# Patient Record
Sex: Male | Born: 1975 | Race: White | Hispanic: No | Marital: Married | Smoking: Never smoker
Health system: Southern US, Community
[De-identification: ages and names within clinical notes are randomized; demographics above are authoritative.]

## PROBLEM LIST (undated history)

## (undated) HISTORY — PX: KNEE RECONSTRUCTION: SHX5883

---

## 2013-12-30 ENCOUNTER — Inpatient Hospital Stay: Payer: Self-pay | Admitting: Internal Medicine

## 2013-12-30 LAB — URINALYSIS, COMPLETE
Bacteria: NONE SEEN
Bilirubin,UR: NEGATIVE
Glucose,UR: NEGATIVE mg/dL (ref 0–75)
Ketone: NEGATIVE
Leukocyte Esterase: NEGATIVE
Nitrite: NEGATIVE
PH: 6 (ref 4.5–8.0)
PROTEIN: NEGATIVE
Specific Gravity: 1.023 (ref 1.003–1.030)
Squamous Epithelial: NONE SEEN
WBC UR: 1 /HPF (ref 0–5)

## 2013-12-30 LAB — DIFFERENTIAL
BASOS ABS: 0 10*3/uL (ref 0.0–0.1)
BASOS PCT: 0.5 %
Eosinophil #: 0.1 10*3/uL (ref 0.0–0.7)
Eosinophil %: 0.6 %
LYMPHS ABS: 0.9 10*3/uL — AB (ref 1.0–3.6)
Lymphocyte %: 10.2 %
MONO ABS: 1.4 x10 3/mm — AB (ref 0.2–1.0)
Monocyte %: 15.6 %
NEUTROS PCT: 73.1 %
Neutrophil #: 6.6 10*3/uL — ABNORMAL HIGH (ref 1.4–6.5)

## 2013-12-30 LAB — COMPREHENSIVE METABOLIC PANEL
ALBUMIN: 3.3 g/dL — AB (ref 3.4–5.0)
Alkaline Phosphatase: 78 U/L
Anion Gap: 7 (ref 7–16)
BUN: 10 mg/dL (ref 7–18)
Bilirubin,Total: 0.6 mg/dL (ref 0.2–1.0)
Calcium, Total: 8.5 mg/dL (ref 8.5–10.1)
Chloride: 99 mmol/L (ref 98–107)
Co2: 27 mmol/L (ref 21–32)
Creatinine: 0.86 mg/dL (ref 0.60–1.30)
GLUCOSE: 101 mg/dL — AB (ref 65–99)
OSMOLALITY: 266 (ref 275–301)
Potassium: 4.2 mmol/L (ref 3.5–5.1)
SGOT(AST): 50 U/L — ABNORMAL HIGH (ref 15–37)
SGPT (ALT): 81 U/L — ABNORMAL HIGH (ref 12–78)
Sodium: 133 mmol/L — ABNORMAL LOW (ref 136–145)
Total Protein: 7.6 g/dL (ref 6.4–8.2)

## 2013-12-30 LAB — CBC
HCT: 40.4 % (ref 40.0–52.0)
HGB: 13.9 g/dL (ref 13.0–18.0)
MCH: 29.4 pg (ref 26.0–34.0)
MCHC: 34.3 g/dL (ref 32.0–36.0)
MCV: 86 fL (ref 80–100)
Platelet: 147 10*3/uL — ABNORMAL LOW (ref 150–440)
RBC: 4.72 10*6/uL (ref 4.40–5.90)
RDW: 13.3 % (ref 11.5–14.5)
WBC: 9 10*3/uL (ref 3.8–10.6)

## 2013-12-30 LAB — SEDIMENTATION RATE: Erythrocyte Sed Rate: 46 mm/hr — ABNORMAL HIGH (ref 0–15)

## 2013-12-30 LAB — CK: CK, TOTAL: 34 U/L — AB

## 2013-12-30 LAB — MONONUCLEOSIS SCREEN: Mono Test: NEGATIVE

## 2013-12-31 LAB — CBC WITH DIFFERENTIAL/PLATELET
Basophil #: 0 10*3/uL (ref 0.0–0.1)
Basophil %: 0.5 %
EOS ABS: 0.1 10*3/uL (ref 0.0–0.7)
Eosinophil %: 1.1 %
HCT: 36.5 % — ABNORMAL LOW (ref 40.0–52.0)
HGB: 12.5 g/dL — ABNORMAL LOW (ref 13.0–18.0)
LYMPHS ABS: 1 10*3/uL (ref 1.0–3.6)
LYMPHS PCT: 13.6 %
MCH: 29.5 pg (ref 26.0–34.0)
MCHC: 34.4 g/dL (ref 32.0–36.0)
MCV: 86 fL (ref 80–100)
Monocyte #: 1.5 x10 3/mm — ABNORMAL HIGH (ref 0.2–1.0)
Monocyte %: 20.1 %
Neutrophil #: 4.7 10*3/uL (ref 1.4–6.5)
Neutrophil %: 64.7 %
Platelet: 142 10*3/uL — ABNORMAL LOW (ref 150–440)
RBC: 4.26 10*6/uL — AB (ref 4.40–5.90)
RDW: 12.7 % (ref 11.5–14.5)
WBC: 7.3 10*3/uL (ref 3.8–10.6)

## 2013-12-31 LAB — COMPREHENSIVE METABOLIC PANEL
ALK PHOS: 69 U/L
ALT: 67 U/L (ref 12–78)
AST: 38 U/L — AB (ref 15–37)
Albumin: 3 g/dL — ABNORMAL LOW (ref 3.4–5.0)
Anion Gap: 7 (ref 7–16)
BUN: 9 mg/dL (ref 7–18)
Bilirubin,Total: 0.5 mg/dL (ref 0.2–1.0)
Calcium, Total: 8.1 mg/dL — ABNORMAL LOW (ref 8.5–10.1)
Chloride: 101 mmol/L (ref 98–107)
Co2: 24 mmol/L (ref 21–32)
Creatinine: 0.89 mg/dL (ref 0.60–1.30)
EGFR (African American): >60
EGFR (Non-African Amer.): >60
Glucose: 101 mg/dL — ABNORMAL HIGH (ref 65–99)
Osmolality: 263 (ref 275–301)
POTASSIUM: 3.9 mmol/L (ref 3.5–5.1)
Sodium: 132 mmol/L — ABNORMAL LOW (ref 136–145)
TOTAL PROTEIN: 6.8 g/dL (ref 6.4–8.2)

## 2013-12-31 LAB — RAPID HIV-1/2 QL/CONFIRM: HIV-1/2,Rapid Ql: NEGATIVE

## 2014-01-01 LAB — CBC WITH DIFFERENTIAL/PLATELET
BASOS ABS: 1 %
Basophil #: 0 10*3/uL (ref 0.0–0.1)
Basophil %: 0.6 %
COMMENT - H1-COM1: NORMAL
Eosinophil #: 0.1 10*3/uL (ref 0.0–0.7)
Eosinophil %: 1.7 %
HCT: 36.9 % — ABNORMAL LOW (ref 40.0–52.0)
HGB: 12.5 g/dL — AB (ref 13.0–18.0)
Lymphocyte #: 1.1 10*3/uL (ref 1.0–3.6)
Lymphocyte %: 15.4 %
Lymphocytes: 16 %
MCH: 29.3 pg (ref 26.0–34.0)
MCHC: 34 g/dL (ref 32.0–36.0)
MCV: 86 fL (ref 80–100)
Monocyte #: 1.2 x10 3/mm — ABNORMAL HIGH (ref 0.2–1.0)
Monocyte %: 16.9 %
Monocytes: 12 %
NEUTROS ABS: 4.6 10*3/uL (ref 1.4–6.5)
NEUTROS PCT: 65.4 %
Platelet: 154 10*3/uL (ref 150–440)
RBC: 4.28 10*6/uL — ABNORMAL LOW (ref 4.40–5.90)
RDW: 12.9 % (ref 11.5–14.5)
Segmented Neutrophils: 71 %
WBC: 7 10*3/uL (ref 3.8–10.6)

## 2014-01-01 LAB — LACTATE DEHYDROGENASE: LDH: 246 U/L — ABNORMAL HIGH (ref 85–241)

## 2014-01-02 LAB — CBC WITH DIFFERENTIAL/PLATELET
BASOS ABS: 0 10*3/uL (ref 0.0–0.1)
BASOS PCT: 0.5 %
Eosinophil #: 0.2 10*3/uL (ref 0.0–0.7)
Eosinophil %: 2.4 %
HCT: 36.7 % — ABNORMAL LOW (ref 40.0–52.0)
HGB: 12.6 g/dL — AB (ref 13.0–18.0)
Lymphocyte #: 1 10*3/uL (ref 1.0–3.6)
Lymphocyte %: 14.3 %
MCH: 29.4 pg (ref 26.0–34.0)
MCHC: 34.4 g/dL (ref 32.0–36.0)
MCV: 86 fL (ref 80–100)
Monocyte #: 1 x10 3/mm (ref 0.2–1.0)
Monocyte %: 14.9 %
NEUTROS ABS: 4.5 10*3/uL (ref 1.4–6.5)
Neutrophil %: 67.9 %
Platelet: 166 10*3/uL (ref 150–440)
RBC: 4.28 10*6/uL — AB (ref 4.40–5.90)
RDW: 13 % (ref 11.5–14.5)
WBC: 6.6 10*3/uL (ref 3.8–10.6)

## 2014-01-03 LAB — BASIC METABOLIC PANEL
Anion Gap: 8 (ref 7–16)
BUN: 7 mg/dL (ref 7–18)
CHLORIDE: 104 mmol/L (ref 98–107)
Calcium, Total: 7.9 mg/dL — ABNORMAL LOW (ref 8.5–10.1)
Co2: 24 mmol/L (ref 21–32)
Creatinine: 0.74 mg/dL (ref 0.60–1.30)
EGFR (African American): >60
EGFR (Non-African Amer.): >60
Glucose: 127 mg/dL — ABNORMAL HIGH (ref 65–99)
Osmolality: 272 (ref 275–301)
Potassium: 3.6 mmol/L (ref 3.5–5.1)
Sodium: 136 mmol/L (ref 136–145)

## 2014-01-04 LAB — CULTURE, BLOOD (SINGLE)

## 2014-01-11 ENCOUNTER — Ambulatory Visit: Payer: Self-pay

## 2014-05-19 ENCOUNTER — Ambulatory Visit: Payer: Self-pay | Admitting: Family Medicine

## 2014-06-15 ENCOUNTER — Ambulatory Visit: Payer: Self-pay

## 2015-01-01 NOTE — Consult Note (Signed)
PATIENT NAME:  Joe Morgan, Joe Morgan#:  [id number] DATE OF BIRTH:  08-25-1976  DATE OF CONSULTATION:  12/31/2013  REFERRING PHYSICIAN:  Vipul S. Sherryll BurgerShah, MD  CONSULTING PHYSICIAN:  Stann Mainlandavid P. Sampson GoonFitzgerald, MD  REASON FOR CONSULTATION: Fever of unknown origin and lymphadenopathy.   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 He states that he had had no real preceding symptoms when he started to develop fatigue and a fever. He was seen in an urgent care and given a Z-Pak, although no testing was done at that time. He says it did not help with his symptoms, and he went back to an urgent care where he had some blood work done and had a prostate exam that felt quite tender. It is unclear if a urinalysis or urine culture was done at that time, but he was started on ciprofloxacin. He said he started to feel a little better but continued to have fevers up to 103. He was admitted with continued fevers and tachycardia. He had a CT scan done that showed some retroperitoneal adenopathy.   Currently, the patient says he had less of a fever last night after receiving a dose of Zosyn but has been febrile again today this afternoon. He denies any headaches, neck stiffness, sore throat, ear pain, swollen glands, chest pain, cough, shortness of breath, abdominal pain, nausea, vomiting or diarrhea. He has not really had any dysuria or hematuria. He has no rash, joint pain or swelling.   PAST MEDICAL HISTORY: Prostatitis, 2 episodes in the past.   SURGICAL HISTORY: Right and left knee reconstructive surgery in the past.   ALLERGIES: No known drug allergies.   MEDICATIONS AT HOME: Propranolol and ciprofloxacin.   Since admission, he has received a dose of Zosyn.   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 He has not had any exposures to  any illnesses over the last several months. He has 4 children who are 10 and above. None of them have had any illnesses. There is a high school student who is a foreign Therapist, sportsexchange student from [place] living with them, but he has been well. He does have some pet dogs, cats and a [place] pig but no farm animals. He has been doing some remodeling in the house but no exposure to dust, molds or chemicals. He is outdoors quite a bit but cannot recall any tick bites. He has had no recent travel outside of the state.   FAMILY HISTORY: Positive for a grandfather with stomach cancer.   REVIEW OF SYSTEMS: Eleven systems reviewed and negative except as per HPI.   PHYSICAL EXAM:  VITAL SIGNS: On admission, his temperature was 101.9. Since admission, his T-max has been 102. Pulse 75, blood pressure 114/66, respirations 18, pulse ox 100% on room air.  GENERAL: He is pleasant, interactive, not confused, in no acute distress, lying in bed.  HEENT: Pupils equal, round and reactive to light and accommodation. Extraocular movements are intact. Sclerae are anicteric. Oropharynx is clear. He has no thrush. He has no oral lesions. He has no pharyngeal exudate.  NECK: Supple. He has no anterior cervical, supraclavicular or posterior cervical lymphadenopathy.  HEART: Regular.  LUNGS: Clear.  ABDOMEN: Soft, nontender and nondistended. No hepatosplenomegaly.  EXTREMITIES: There is no clubbing, cyanosis or edema.  SKIN: There is  no rash or joint swelling. He has no edema.  NEUROLOGIC: He is alert and oriented x 3. Cranial nerves II through XII are intact. Strength is 5 out of 5 bilateral upper extremities and lower extremities.   DATA: White blood count 9.0 on admission, 7.3 today. Sed rate is 46. Hemoglobin 12.5, platelets 142. Differential shows 4.7 neutrophils, 1.0 lymphocytes and 1.5 monocytes. Blood cultures x 2 are negative. Urinalysis had 2 red cells and 1 white cell. Monospot was negative. LFTs showed a mild elevation of  ALT of 81, AST 50, albumin slightly low at 3.0. Renal function is normal. RPR and Lyme disease antibodies, as well as [facility] spotted fever, are pending.   IMAGING: A CT scan of his abdomen done on admission showed mild abnormal retroperitoneal adenopathy with some stranding in the fat associated with the adenopathy. The liver, gallbladder, spleen, pancreas, adrenal glands and kidneys are normal. No bowel wall thickening. There are some small mesenteric nodes. Bladder and prostate are unremarkable. No bony abnormalities. Chest x-ray revealed no acute abnormalities.   IMPRESSION: A 39 year old previously quite healthy gentleman who works as a Emergency planning/management officer but has no sick exposures, unusual contacts or travel history. Admitted with fevers for 11 days. He has minimal other symptoms except for night sweats and some loss of appetite. So far, blood work shows a normal white count except for mild increase in his monocytes. He had a mild increase in his ALT and AST as well, but these were normal. He seemed to have been treated for prostatitis, although it is unclear if culture results are available. CT of the abdomen does show some mild lymphadenopathy.   1. At this point, I would recommend we treat him empirically for tickborne illnesses with doxycycline, especially given the mild thrombocytopenia.  2. He says he defervesced with 1 dose of Zosyn given yesterday. I am going to put him back on that empirically awaiting the blood culture results.  3. He has Lyme and [facility] spotted fever serologies pending. Ehrlichia EBV, CMV and parvovirus B19 PCRs. Also check an HIV test.  4. Depending on the results of these tests and further clinical evaluation, further recommendations will be pending.   Thank you for the consult.   ____________________________ Stann Mainland. Sampson Goon, MD dpf:gb D: 12/31/2013 20:10:18 ET T: 01/01/2014 00:11:53 ET JOB#: [id number]  cc: Stann Mainland. Sampson Goon, MD, <Dictator> Briona Korpela  Sampson Goon MD ELECTRONICALLY SIGNED 01/06/2014 11:51

## 2015-01-01 NOTE — Discharge Summary (Signed)
PATIENT NAME:  Joe Morgan, Joe Morgan MR#:  [id number] DATE OF BIRTH:  [birth year]  DATE OF ADMISSION:  12/30/2013 DATE OF DISCHARGE:  01/03/2014 Joe Morgan ADMISSION DIAGNOSIS: Fever of unknown origin.   DISCHARGE DIAGNOSES: 1. Fever of unknown origin. 2. Thrombocytopenia.  3. Hyponatremia.   CONSULTATIONS:  1. Dr. Sampson Morgan.  2. Dr. Wendie Simmeramiah.   DISCHARGE LABORATORIES: Sodium 136, potassium 3.6, chloride 104, bicarbonate 24, BUN 7, creatinine 0.74 glucose is 127.  Rapid HIV was negativerocky MT IgM was negative. IgG ROCKY MT was negative, ehrlichia antibody panel was negative.  Lyme disease was normal. RPR was negative. Mono test was negative.   HOSPITAL COURSE: A 39 year old male who presented with 10 days of fevers at home with a recent diagnosis of prostatitis. For further details, please refer to the H and P.  1. Fever of unknown origin. The patient had a full workup. So far everything has been negative. We are uncertain of the etiology. Infectious disease was consulted with Dr. Sampson Morgan. He was initially placed on Zosyn changed to Unasyn. He has been afebrile with temperatures less than 100.4 and is doing okay. He will be discharged with Augmentin for 10 days. He will need a repeat CT of the abdomen in 3 months due to a small lymph node seen on CT scan on admission. 5. Thrombocytopenia, improved. 6. Hyponatremia, improved.  7. History of prostatitis, was on ciprofloxacin prior to admission.   DISCHARGE MEDICATIONS:  1. Augmentin 875 b.i.d. for 10 days.  2. Propranolol 20 mg daily.   DISCHARGE DIET: Regular diet.   DISCHARGE ACTIVITY: As tolerated.  DISCHARGE FOLLOWUP: Patient to follow up with Dr. Sampson Morgan in 1 week.  TIME SPENT: 35 minutes.   The patient is stable for discharge.   ____________________________ Joe Morgan P. Joe PinaMody, MD spm:lt D: 01/03/2014 12:43:30 ET T: 01/04/2014 00:58:29 ET JOB#: [id number]  cc: Joe Morgan P. Joe PinaMody, MD, <Dictator> Joe Mainlandavid P. Sampson GoonFitzgerald, MD Joe PesaSITAL P Karlie Aung  MD ELECTRONICALLY SIGNED 01/04/2014 12:10

## 2015-01-01 NOTE — H&P (Signed)
PATIENT NAME:  Joe Morgan, Joe Morgan MR#:  [id number] DATE OF BIRTH:  1975-09-30  DATE OF ADMISSION:  12/30/2013  PRIMARY CARE PHYSICIAN:  None.  REFERRING PHYSICIAN:  Dr. Ferman Hamming.   CHIEF COMPLAINT: Fever.   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 The patient has been running fever for last 10 days, went to [facility] urgent care last Wednesday when he was given prescription for Z-Pak for 5 days, stating he might have had secondary infection in the lung with possible flulike illness. The patient took it for 3 days, but did not get a whole lot better and continued to get fever and went back to urgent care last Sunday, when he had a full evaluation and was informed that he might have had prostatitis, based on prostrate exam at the urgent care where his prostate was found to be a little bit bigger, and he also had some blood in the urine. The patient took 7 doses of ciprofloxacin without significant benefit, had his fever spike up to 103.3 last night and decided to come to the Emergency Department. While in the ED, he is feeling very tired, and he had a temperature of 101.9. He was tachycardic up to 103 and tachypneic with respirations of 22 per minute, and he is being admitted for further evaluation and management, as his CT scan of the abdomen and pelvis showed significant amount of retroperitoneal adenopathy with stranding. The patient denies any travel history. Denies any bird or occupational exposure. He also denies taking any other drugs other than propranolol, which he has been on for a long time. He denies any localizing symptoms other than just generalized fatigue. He does have a cat and dog and one other animal, but no birds at home. He denies any other sick contacts. Denies any possibility of hepatitis or HIV or sexually transmitted disease. He does report losing 8 to 9 pounds over the last 10 days due to poor appetite. His  fever runs usually between 99.5 and 100.5, but about every third to fourth day, does spike up 103, which he had last night, up to 103.3, and he breaks out with profuse sweat and he refuses to take any Tylenol, so he usually requests just changing the sheets every time he sweats out.  He feels he might have overdone some work last night with his son's game and has been feeling very tired.   PAST MEDICAL HISTORY:  Prostatitis, 2 episodes in the last 20 years; last one being 6 to 7 years ago.   PAST SURGICAL HISTORY:  Right and left knee reconstruction surgery about 16 to 17 years ago.   MEDICATIONS AT HOME: 1.  Propranolol 20 mg p.o. daily for benign tremors.  2.  Antibiotic, ciprofloxacin 500 mg p.o. b.i.d., which was prescribed last Sunday.   ALLERGIES: No known drug allergies.   SOCIAL HISTORY: No smoking or alcohol. 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 He resides in [place], [place].   FAMILY HISTORY: The patient's paternal grandfather had stomach cancer, maternal grandfather had diabetes and heart problems.   REVIEW OF SYSTEMS:  CONSTITUTIONAL: Positive for fever, fatigue, weakness and weight loss of about 8 to 9 pounds over the last 10 days.  EYES: No blurred or double vision.  ENT: No tinnitus or ear pain.  RESPIRATORY: No cough, wheezing, hemoptysis.  CARDIOVASCULAR: No chest pain, orthopnea, edema.  GASTROINTESTINAL: No nausea, vomiting, diarrhea.  GENITOURINARY: No dysuria or  hematuria.  ENDOCRINE: No polyuria or nocturia.  HEMATOLOGY: No anemia or easy bruising.  SKIN: No rash or lesion.  MUSCULOSKELETAL: Generalized body aches. No arthritis.  NEUROLOGIC: No tingling, numbness, weakness.  PSYCHIATRIC: No history of anxiety or depression.  SKIN: No obvious rash, lesion or ulcer.   PHYSICAL EXAMINATION: VITAL SIGNS: Temperature 101.9, heart about 103 per minute, respirations 22 per minute, blood pressure 127/72 mmHg. He was saturating 96% on room air.  GENERAL:   The patient is a 39 year old white male lying in the bed, comfortably without any acute distress.  EYES: Pupils equal, round, reactive to light and accommodation. No scleral icterus. Extraocular  muscles intact.   HENT: Head atraumatic, normocephalic. Oropharynx and nasopharynx clear.  NECK: Supple. No jugular venous distension.  No enlarged thyroid. No tenderness.  LUNGS: Clear to auscultation bilaterally. No wheezing, rales, rhonchi or crepitation.  CARDIOVASCULAR: S1, S2 normal. No murmurs, rubs or gallops.  ABDOMEN: Soft, nontender, nondistended. Bowel sounds present. No organomegaly or mass.  EXTREMITIES: No pedal edema, cyanosis or clubbing.  NEUROLOGIC: Cranial nerves III through XII intact. Muscle strength 5/5 in all extremities. Sensation intact.  PSYCHIATRIC: The patient is alert and oriented x 3.  SKIN: No obvious rash, lesion or ulcer.  MUSCULOSKELETAL: No obvious joint effusion or tenderness.   LABORATORY DATA: Normal BMP, except sodium 133. Normal CBC, except platelet count of 147. Negative UA. Mono test was negative. Liver function tests showed AST of 50, ALT of 81.   Chest x-ray in the ED showed no acute cardiopulmonary disease.   CT scan of the abdomen and pelvis with contrast showed abnormal retroperitoneal adenopathy associated with stranding, concerning for inflammatory process; malignancy not excluded. Consider opportunistic infection or viral adenitis.   IMPRESSION AND PLAN: 1.  Fever of unknown origin. Certainly could be viral, although cannot rule out any bacterial or any other infectious etiology. Other possibilities certainly can be malignancy, including lymphoma or connective tissue disorders, including rheumatological disease. The patient denies any travel, animal exposure or occupational exposure. Denies any immunosuppression or drug history. He does not have any localizing symptoms either. Considering his recurrent nature of fever with fever curve and weight loss  along with abdominal adenopathy, cannot rule out lymphoma at this time, considering his excessive sweating.  We will consult oncology, along with infectious disease for further evaluation. We will monitor his fever curve very closely. We will order ESR, C-reactive protein at this time. His chest x-ray has been clear.  Two sets of blood cultures have been ordered. We will order sputum culture.  2. Thrombocytopenia. Again, could go with his fever of unknown origin, probably from underlying infection. Certainly could have lymphoma. We will monitor his platelet count.  3.  Hyponatremia, likely due to dehydration with poor p.o. intake. We will hydrate him with IV fluids and monitor his sodium level.  4.  History of prostatitis. I will  hold off on ciprofloxacin right now. We will empirically treat him with Zosyn at this time. He already has a UA, which looks clear.   Total time taking care of this patient is 55 minutes.     ____________________________ Lucina Mellow. Manuella Ghazi, MD vss:dmm D: 12/30/2013 21:22:49 ET T: 12/30/2013 22:03:40 ET JOB#: [id number]  cc: Perrie Ragin S. Manuella Ghazi, MD, <Dictator> Cheral Marker. Ola Spurr, MD Lucina Mellow [facility] MD ELECTRONICALLY SIGNED 01/01/2014 23:16

## 2015-01-01 NOTE — Consult Note (Signed)
Brief Consult Note: Diagnosis: Retroperitoneal LN.   Comments: Small lymph nodes only mildly enlarged. Suspect inflammatory response. Should repeat CT Abd in 3 months.  Electronic Signatures: Antony Hasteamiah, Roshaunda Starkey S (MD)  (Signed 23-Apr-15 10:41)  Authored: Brief Consult Note   Last Updated: 23-Apr-15 10:41 by Antony Hasteamiah, Teryl Mcconaghy S (MD)

## 2015-04-24 IMAGING — CT CT ABD-PELV W/ CM
2 of 4 series · 17 of 46 positions shown, 19 images · IV contrast (isovue)
Comparison: CT abdomen pelvis 12/30/2013

CLINICAL DATA: Fever of unknown origin

EXAM:
CT ABDOMEN AND PELVIS WITH CONTRAST
TECHNIQUE: Multidetector CT imaging of the abdomen and pelvis was performed
using the standard protocol following bolus administration of
intravenous contrast.
CONTRAST:  100 mL Isovue 370 IV

[Series 2: routine with · axial · 0.79mm/px · z∈[-934,-474]mm · 14 of 102 slices shown, 16 images]
[im 5/102  soft-tissue]
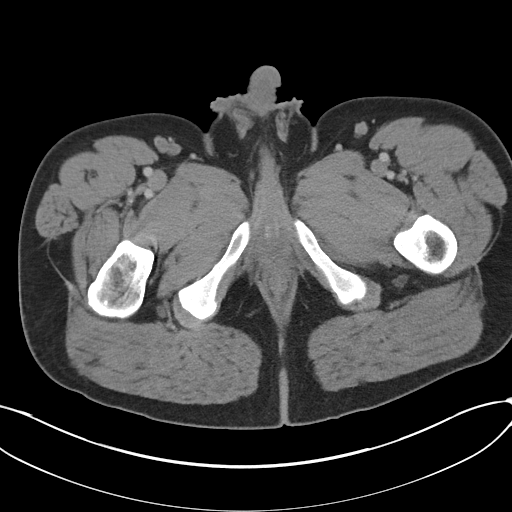
[im 5/102  bone]
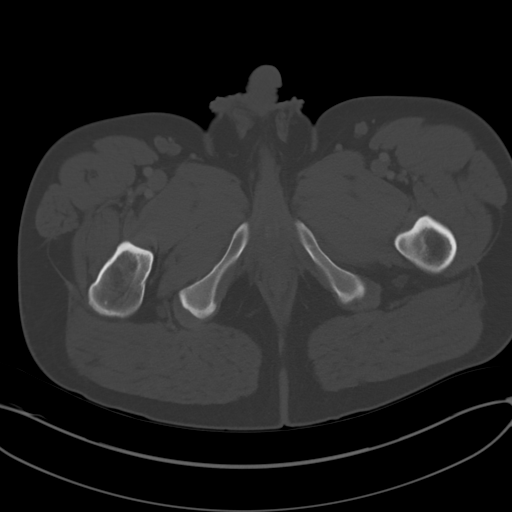
[im 13/102  soft-tissue]
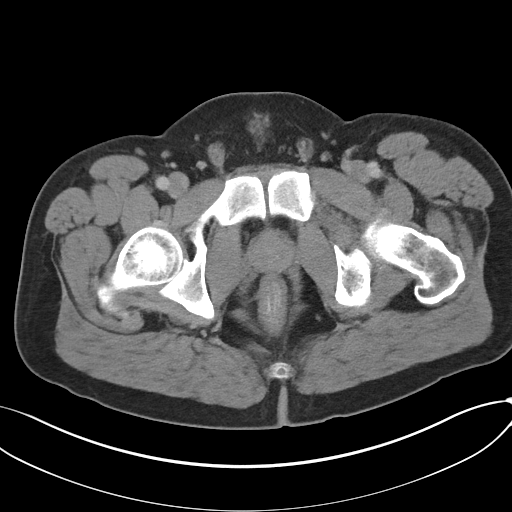
[im 22/102  soft-tissue]
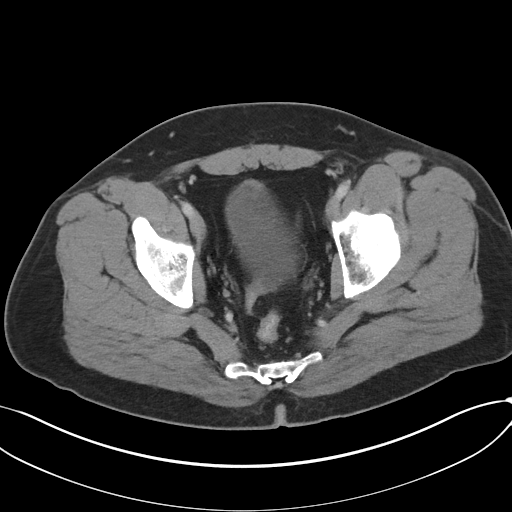
[im 26/102  soft-tissue]
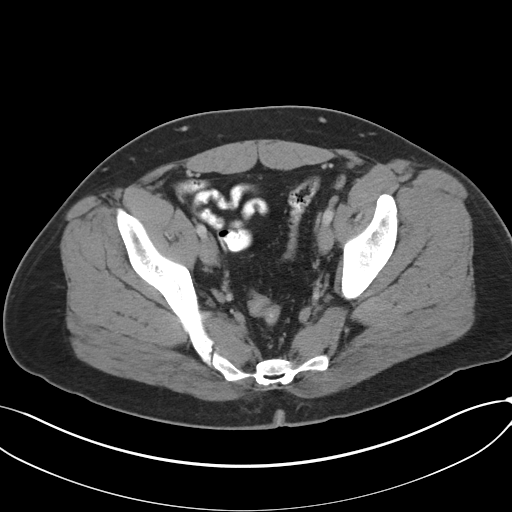
[im 34/102  soft-tissue]
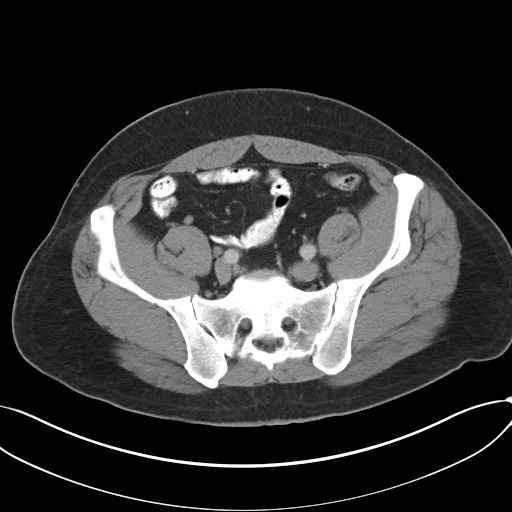
[im 43/102  soft-tissue]
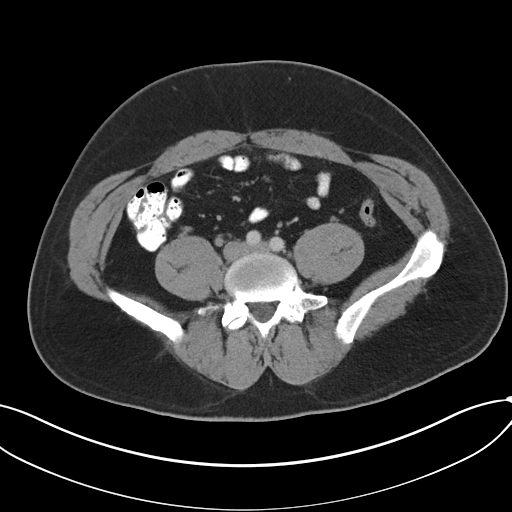
[im 47/102  soft-tissue]
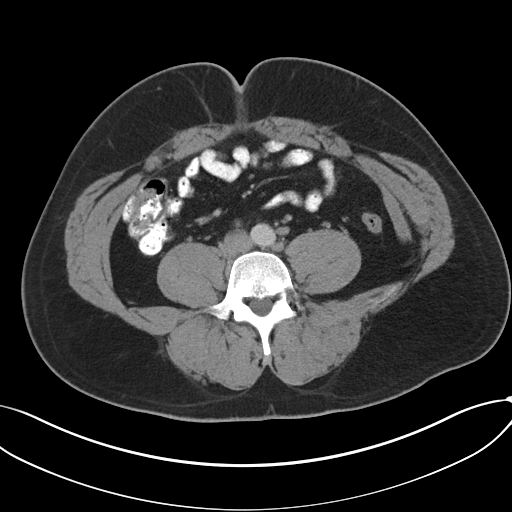
[im 55/102  soft-tissue]
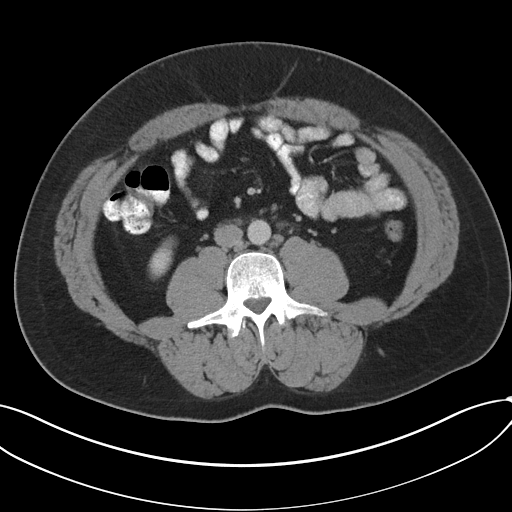
[im 59/102  soft-tissue]
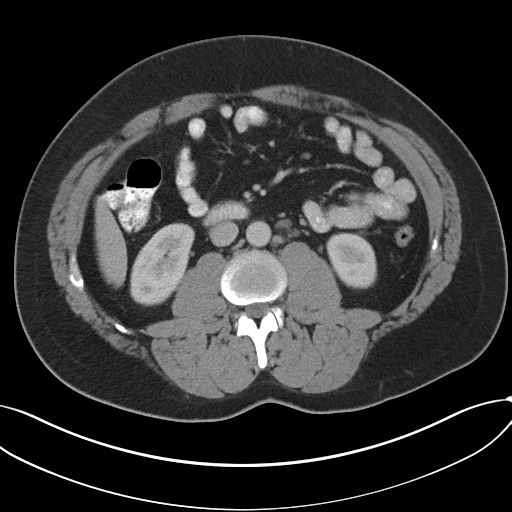
[im 59/102  bone]
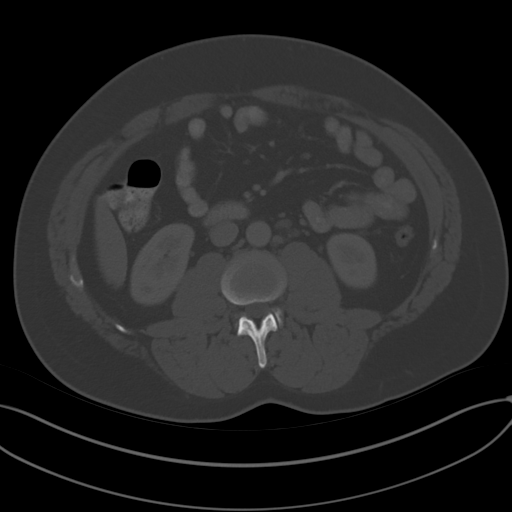
[im 68/102  soft-tissue]
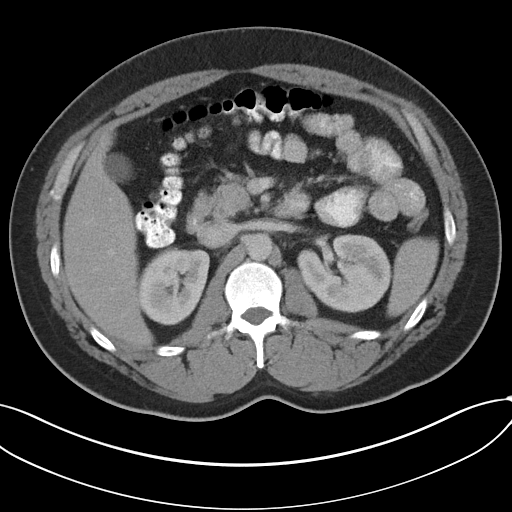
[im 76/102  soft-tissue]
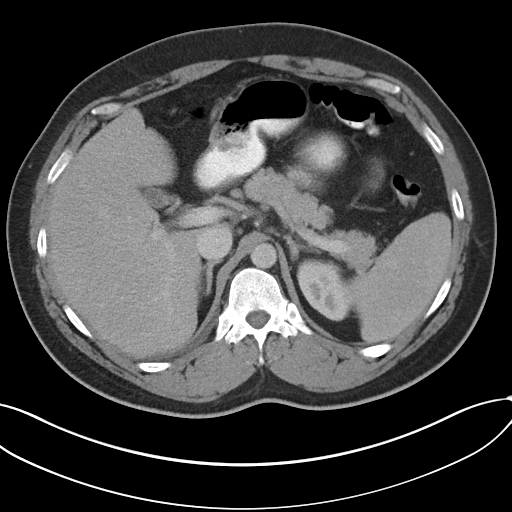
[im 80/102  soft-tissue]
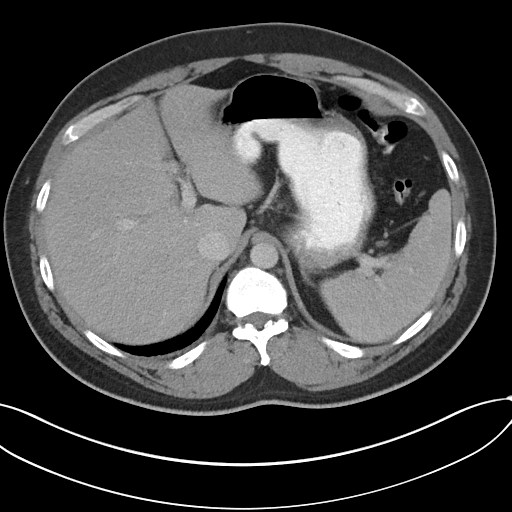
[im 89/102  soft-tissue]
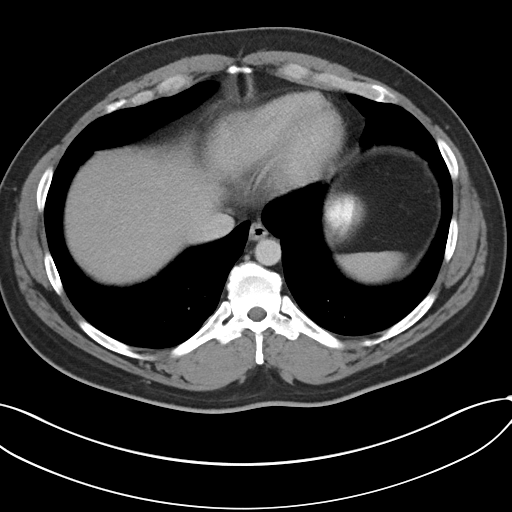
[im 97/102  soft-tissue]
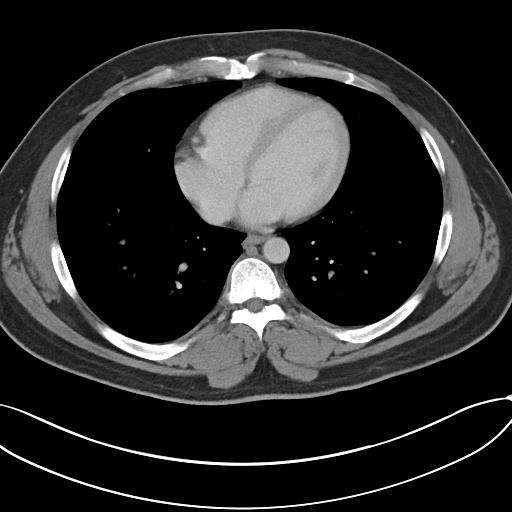

[Series 5: cor routine with · coronal · 0.91mm/px · 3 of 171 slices shown]
[im 57/171  soft-tissue]
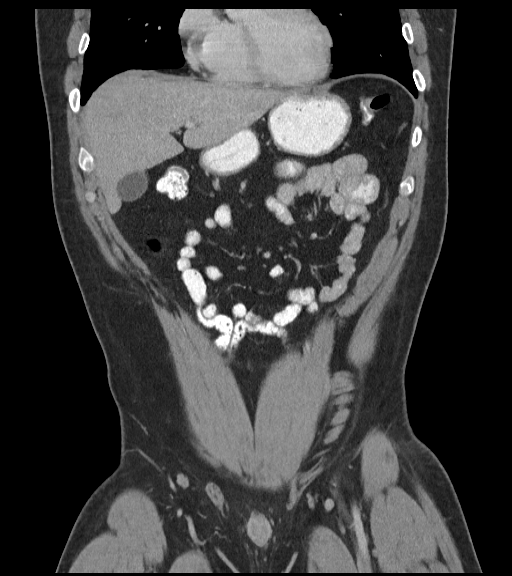
[im 76/171  soft-tissue]
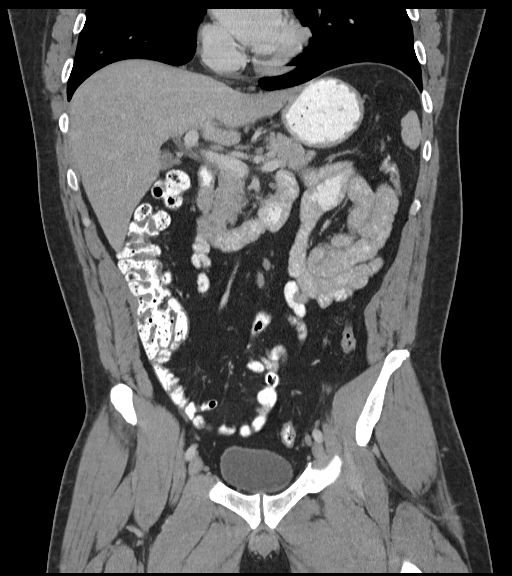
[im 95/171  soft-tissue]
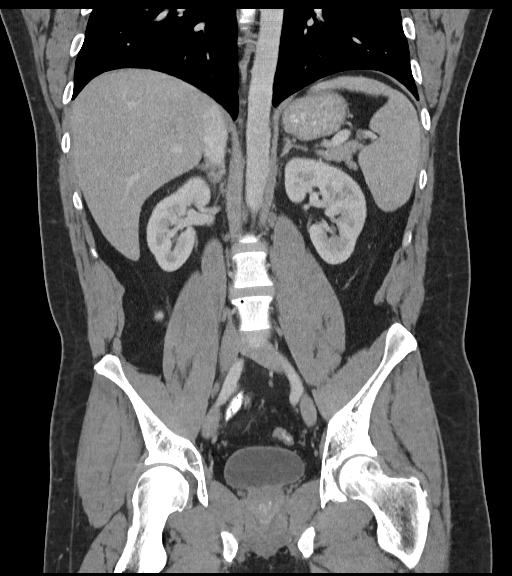

[17 of 46 positions shown; findings below may reference images not displayed]

FINDINGS: Lung bases are clear.  Heart size is normal.

Liver gallbladder and bile ducts are normal. Pancreas and spleen are
normal. Kidneys are normal.

Mild retroperitoneal adenopathy is unchanged. Left periaortic lymph
nodes are mildly prominent and unchanged. These measure up to
approximately 12 mm in size. No new adenopathy. Sub cm right iliac
nodes also stable.

Bladder and prostate are normal.

Appendix is normal. Negative for bowel obstruction or bowel
thickening. No acute bony abnormality.
IMPRESSION: Mild retroperitoneal adenopathy is stable. This is probably
reactive. Lymphoma remains unlikely. No other mass lesion. Normal
appendix. No other source for fever identified.

## 2015-06-09 ENCOUNTER — Other Ambulatory Visit: Payer: Self-pay | Admitting: Family Medicine

## 2015-06-09 NOTE — Telephone Encounter (Signed)
Please let Ramonte Mena know that I'd like to see patient for an appointment here in the office for:  Regular f/u; last visit was over a year ago; if due for physical, please schedule Please schedule a visit with me  in the next: month Fasting?  Yes, if he will get cholesterol labs Thank you, Dr. Sherie Don

## 2015-06-09 NOTE — Telephone Encounter (Signed)
Routing to provider  

## 2015-06-10 NOTE — Telephone Encounter (Signed)
Called patient to the numbers we have and they are no working numbers.

## 2015-10-11 ENCOUNTER — Ambulatory Visit: Payer: Self-pay | Admitting: Family Medicine

## 2015-10-11 DIAGNOSIS — Z87438 Personal history of other diseases of male genital organs: Secondary | ICD-10-CM | POA: Insufficient documentation

## 2015-10-18 DIAGNOSIS — D804 Selective deficiency of immunoglobulin M [IgM]: Secondary | ICD-10-CM | POA: Insufficient documentation

## 2015-10-24 ENCOUNTER — Other Ambulatory Visit: Payer: Self-pay | Admitting: Infectious Diseases

## 2015-10-24 DIAGNOSIS — Z87438 Personal history of other diseases of male genital organs: Secondary | ICD-10-CM

## 2015-10-24 DIAGNOSIS — R509 Fever, unspecified: Secondary | ICD-10-CM

## 2015-10-27 ENCOUNTER — Ambulatory Visit
Admission: RE | Admit: 2015-10-27 | Discharge: 2015-10-27 | Disposition: A | Discharge status: Home / Self Care | Payer: BLUE CROSS/BLUE SHIELD | Source: Ambulatory Visit | Attending: Infectious Diseases | Admitting: Infectious Diseases

## 2015-10-27 DIAGNOSIS — K859 Acute pancreatitis without necrosis or infection, unspecified: Secondary | ICD-10-CM | POA: Diagnosis not present

## 2015-10-27 DIAGNOSIS — Z87438 Personal history of other diseases of male genital organs: Secondary | ICD-10-CM

## 2015-10-27 DIAGNOSIS — R162 Hepatomegaly with splenomegaly, not elsewhere classified: Secondary | ICD-10-CM | POA: Insufficient documentation

## 2015-10-27 DIAGNOSIS — R509 Fever, unspecified: Secondary | ICD-10-CM | POA: Insufficient documentation

## 2015-10-27 MED ORDER — IOHEXOL 350 MG/ML SOLN
100.0000 mL | Freq: Once | INTRAVENOUS | Status: AC | PRN
  Administered 2015-10-27: 100 mL via INTRAVENOUS

## 2015-11-03 ENCOUNTER — Other Ambulatory Visit: Payer: Self-pay | Admitting: Infectious Diseases

## 2015-11-03 DIAGNOSIS — Z87898 Personal history of other specified conditions: Secondary | ICD-10-CM | POA: Insufficient documentation

## 2015-11-03 DIAGNOSIS — R160 Hepatomegaly, not elsewhere classified: Secondary | ICD-10-CM

## 2015-11-03 DIAGNOSIS — R509 Fever, unspecified: Secondary | ICD-10-CM

## 2015-11-04 ENCOUNTER — Other Ambulatory Visit: Payer: Self-pay | Admitting: Radiology

## 2015-11-07 ENCOUNTER — Ambulatory Visit
Admission: RE | Admit: 2015-11-07 | Discharge: 2015-11-07 | Disposition: A | Discharge status: Home / Self Care | Payer: BLUE CROSS/BLUE SHIELD | Source: Ambulatory Visit | Attending: Infectious Diseases | Admitting: Infectious Diseases

## 2015-11-07 ENCOUNTER — Telehealth: Payer: Self-pay | Admitting: Infectious Diseases

## 2015-11-07 DIAGNOSIS — R160 Hepatomegaly, not elsewhere classified: Secondary | ICD-10-CM

## 2015-11-07 DIAGNOSIS — Z8719 Personal history of other diseases of the digestive system: Secondary | ICD-10-CM | POA: Insufficient documentation

## 2015-11-07 DIAGNOSIS — R509 Fever, unspecified: Secondary | ICD-10-CM | POA: Insufficient documentation

## 2015-11-07 LAB — CBC WITH DIFFERENTIAL/PLATELET
Basophils Absolute: 0 10*3/uL (ref 0–0.1)
Basophils Relative: 0 %
EOS PCT: 2 %
Eosinophils Absolute: 0.1 10*3/uL (ref 0–0.7)
HCT: 34.8 % — ABNORMAL LOW (ref 40.0–52.0)
Hemoglobin: 11.4 g/dL — ABNORMAL LOW (ref 13.0–18.0)
LYMPHS ABS: 1.1 10*3/uL (ref 1.0–3.6)
LYMPHS PCT: 17 %
MCH: 26.8 pg (ref 26.0–34.0)
MCHC: 32.9 g/dL (ref 32.0–36.0)
MCV: 81.5 fL (ref 80.0–100.0)
MONOS PCT: 14 %
Monocytes Absolute: 0.9 10*3/uL (ref 0.2–1.0)
NEUTROS ABS: 4.5 10*3/uL (ref 1.4–6.5)
NEUTROS PCT: 67 %
PLATELETS: 218 10*3/uL (ref 150–440)
RBC: 4.27 MIL/uL — AB (ref 4.40–5.90)
RDW: 15 % — AB (ref 11.5–14.5)
WBC: 6.7 10*3/uL (ref 3.8–10.6)

## 2015-11-07 LAB — PROTIME-INR
INR: 1.17
PROTHROMBIN TIME: 15.1 s — AB (ref 11.4–15.0)

## 2015-11-07 LAB — COMPREHENSIVE METABOLIC PANEL
ALT: 28 U/L (ref 17–63)
AST: 20 U/L (ref 15–41)
Albumin: 3.4 g/dL — ABNORMAL LOW (ref 3.5–5.0)
Alkaline Phosphatase: 63 U/L (ref 38–126)
Anion gap: 7 (ref 5–15)
BUN: 9 mg/dL (ref 6–20)
CHLORIDE: 103 mmol/L (ref 101–111)
CO2: 28 mmol/L (ref 22–32)
CREATININE: 0.79 mg/dL (ref 0.61–1.24)
Calcium: 9.2 mg/dL (ref 8.9–10.3)
Glucose, Bld: 94 mg/dL (ref 65–99)
Potassium: 4.3 mmol/L (ref 3.5–5.1)
Sodium: 138 mmol/L (ref 135–145)
Total Bilirubin: 0.5 mg/dL (ref 0.3–1.2)
Total Protein: 7.6 g/dL (ref 6.5–8.1)

## 2015-11-07 LAB — APTT: aPTT: 31 seconds (ref 24–36)

## 2015-11-07 MED ORDER — FENTANYL CITRATE (PF) 100 MCG/2ML IJ SOLN
50.0000 ug | Freq: Once | INTRAMUSCULAR | Status: AC
Start: 2015-11-07 — End: 2015-11-07
  Administered 2015-11-07: 50 ug via INTRAVENOUS

## 2015-11-07 MED ORDER — FENTANYL CITRATE (PF) 100 MCG/2ML IJ SOLN
INTRAMUSCULAR | Status: AC | PRN
  Administered 2015-11-07: 25 ug via INTRAVENOUS
  Administered 2015-11-07: 50 ug via INTRAVENOUS

## 2015-11-07 MED ORDER — SODIUM CHLORIDE 0.9 % IV SOLN: INTRAVENOUS | Status: DC

## 2015-11-07 MED ORDER — MIDAZOLAM HCL 5 MG/5ML IJ SOLN
INTRAMUSCULAR | Status: AC
  Filled 2015-11-07: qty 10

## 2015-11-07 MED ORDER — HYDROCODONE-ACETAMINOPHEN 5-325 MG PO TABS
1.0000 | ORAL_TABLET | ORAL | Status: DC | PRN
  Filled 2015-11-07: qty 2

## 2015-11-07 MED ORDER — FENTANYL CITRATE (PF) 100 MCG/2ML IJ SOLN
INTRAMUSCULAR | Status: AC
  Filled 2015-11-07: qty 4

## 2015-11-07 MED ORDER — MIDAZOLAM HCL 5 MG/5ML IJ SOLN
INTRAMUSCULAR | Status: AC | PRN
  Administered 2015-11-07: 0.5 mg via INTRAVENOUS
  Administered 2015-11-07: 1 mg via INTRAVENOUS
  Administered 2015-11-07: 2 mg via INTRAVENOUS

## 2015-11-07 NOTE — Procedures (Signed)
[place] core liver biopsy 18g core x3 No complication No blood loss. See complete dictation in [facility].

## 2015-11-07 NOTE — Telephone Encounter (Signed)
Ordered fungal and routine cx

## 2015-11-09 LAB — SURGICAL PATHOLOGY

## 2016-10-01 IMAGING — CT CT CHEST W/ CM
1 of 4 series · 14 of 31 positions shown, 18 images · IV contrast (APPLIED)
Comparison: 05/19/2014 abdominal pelvic CT.  06/15/2014 chest CT.

CLINICAL DATA: Fever for 1 month and 5 days. Similar episode 2
years ago. History of prostatitis.

EXAM:
CT CHEST, ABDOMEN, AND PELVIS WITH CONTRAST
TECHNIQUE: Multidetector CT imaging of the chest, abdomen and pelvis was
performed following the standard protocol during bolus
administration of intravenous contrast.
CONTRAST:  100mL OMNIPAQUE IOHEXOL 350 MG/ML SOLN

[Series 2: cap with 2 · axial · 0.83mm/px · z∈[-1000,-390]mm · 14 of 140 slices shown, 18 images]
[im 9/140  mediastinal]
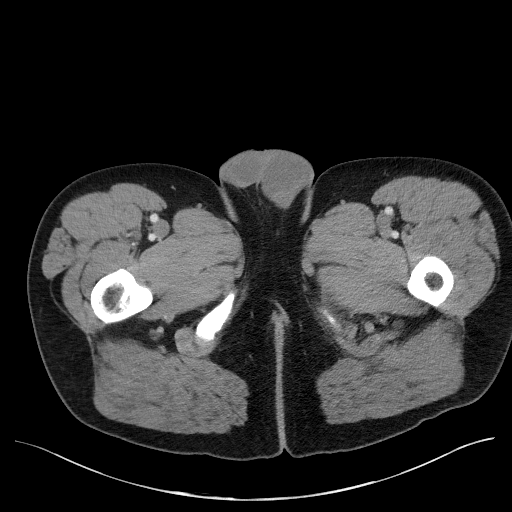
[im 9/140  lung]
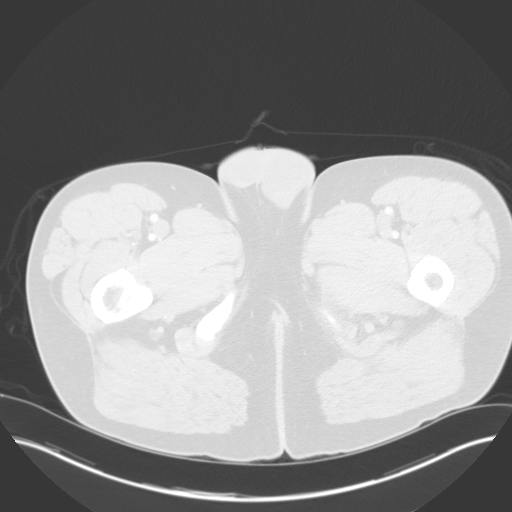
[im 18/140  lung]
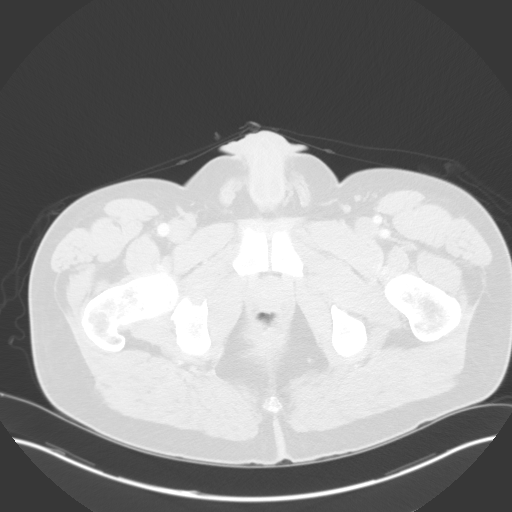
[im 27/140  lung]
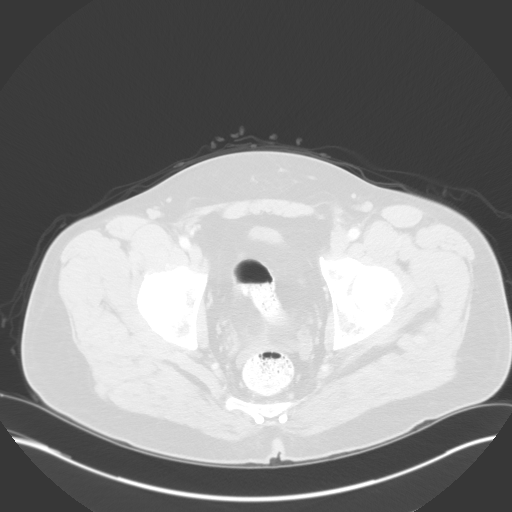
[im 35/140  lung]
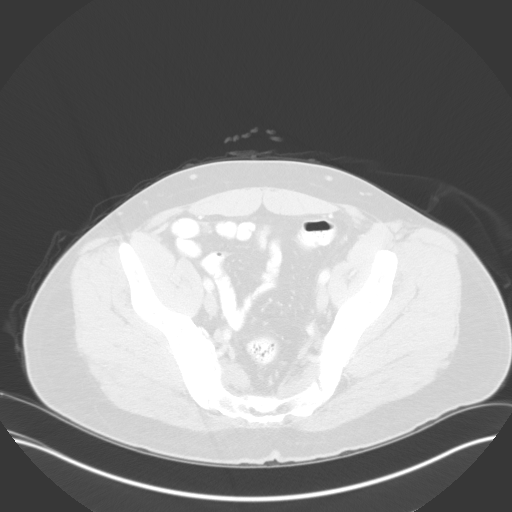
[im 44/140  mediastinal]
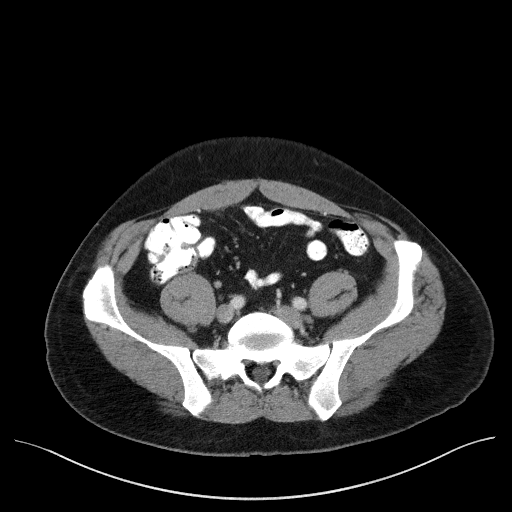
[im 44/140  lung]
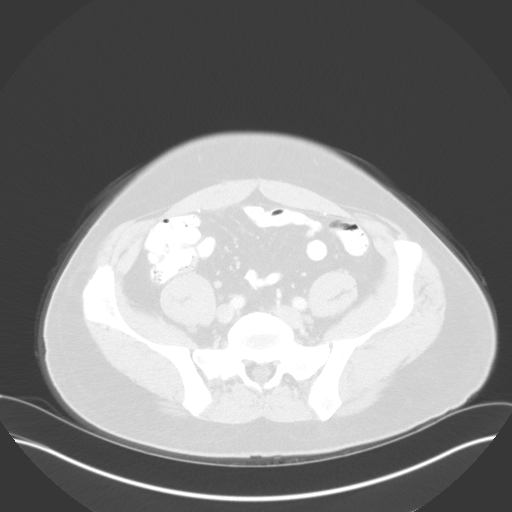
[im 53/140  lung]
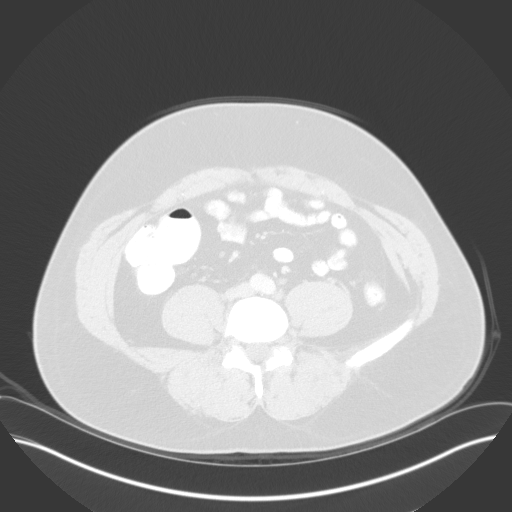
[im 61/140  lung]
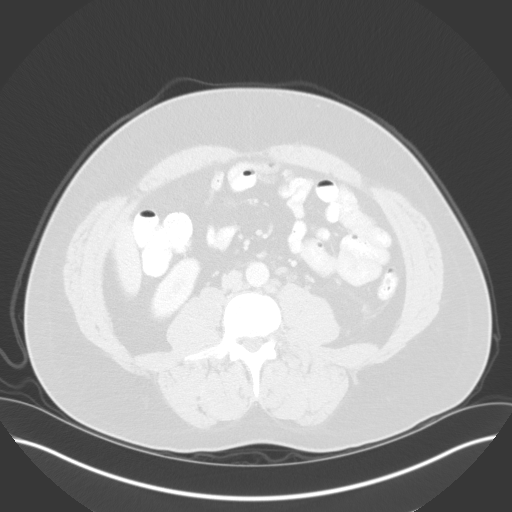
[im 79/140  lung]
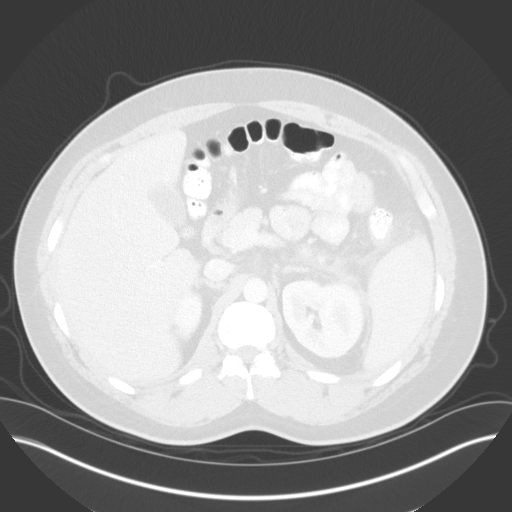
[im 87/140  mediastinal]
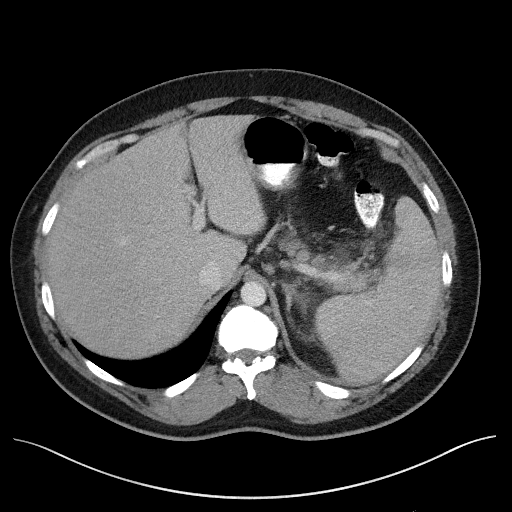
[im 87/140  lung]
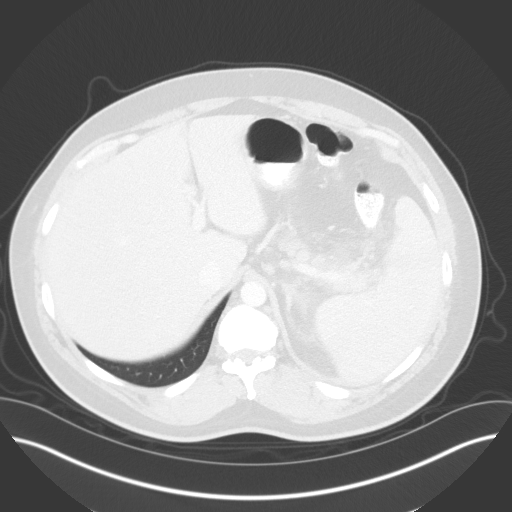
[im 96/140  lung]
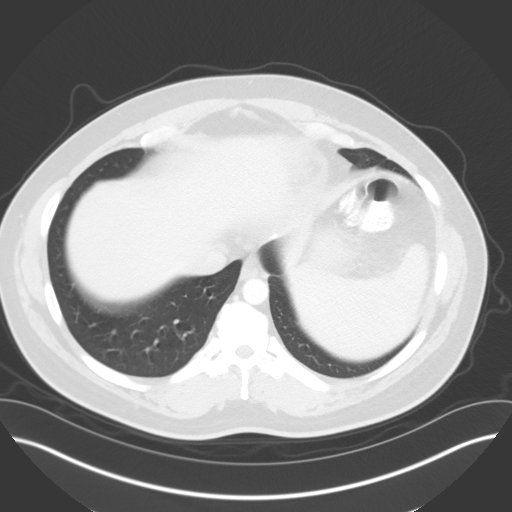
[im 105/140  lung]
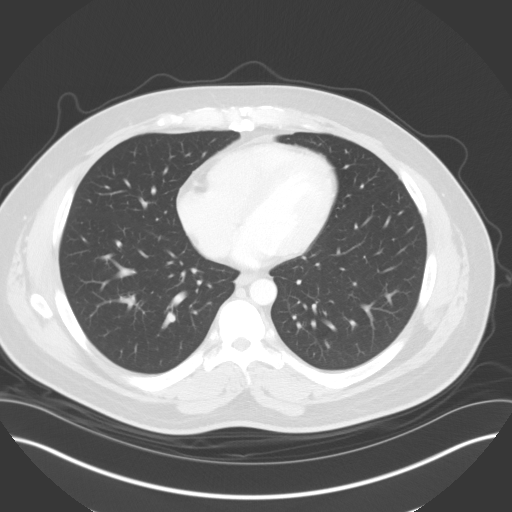
[im 113/140  lung]
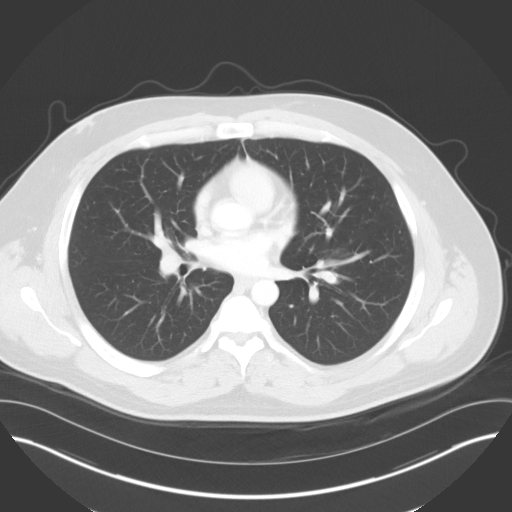
[im 122/140  mediastinal]
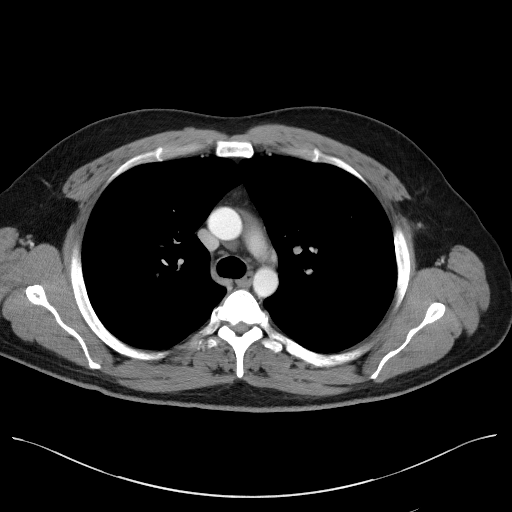
[im 122/140  lung]
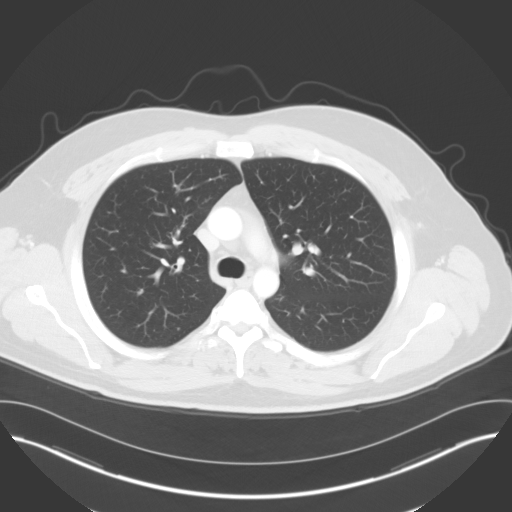
[im 131/140  lung]
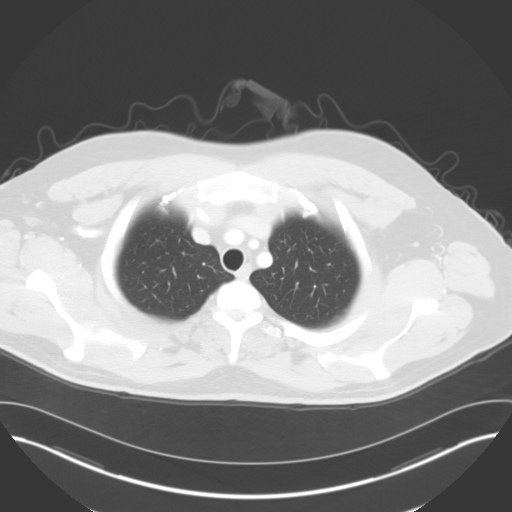

[14 of 31 positions shown; findings below may reference images not displayed]

FINDINGS: CT CHEST

Mediastinum/Nodes: No supraclavicular adenopathy. No axillary
adenopathy. Normal heart size, without pericardial effusion.
Resolution of borderline right paratracheal adenopathy since 4233.
No hilar adenopathy.

Lungs/Pleura: No pleural fluid. Posterior left upper lobe 2 mm
nodule is similar on image 13/series 4 and can be considered benign.
Lungs are otherwise clear.

Musculoskeletal: No acute osseous abnormality.

CT ABDOMEN AND PELVIS

Hepatobiliary: New hepatomegaly, 20.6 cm craniocaudal. No focal
liver lesion. Normal gallbladder, without biliary ductal dilatation.

Pancreas: Edema centered around the pancreatic tail and throughout
the left side of the anterior pararenal space. Example image 58/
series 2. No peripancreatic fluid collection. No duct dilatation or
area of pancreatic necrosis.

Spleen: Splenomegaly, 14.8 cm craniocaudal, new. Vague heterogeneous
splenic density, without well-defined dominant mass.

Adrenals/Urinary Tract: Normal adrenal glands. Normal kidneys,
without hydronephrosis. Normal urinary bladder.

Stomach/Bowel: Normal stomach, without wall thickening. Normal
colon, appendix, and terminal ileum. Normal small bowel.

Vascular/Lymphatic: Normal caliber of the aorta and branch vessels.
multiple retroperitoneal nodes are again identified. Index left
periaortic node measures 8 mm on image 74/series 2 and is unchanged.

More inferior left periaortic node measures 11 mm on image 82/series
2 versus 10 mm on the prior. No pelvic adenopathy.

Reproductive: Normal prostate.

Other: Trace cul-de-sac fluid, including on image 115/series 2. New.

Musculoskeletal: No acute osseous abnormality.
IMPRESSION: 1. Peripancreatic edema, suspicious for non complicated
pancreatitis. Correlate pancreatic enzyme levels.
2. Similar to minimal increase in mild retroperitoneal adenopathy
since 05/19/2014. Interval development of hepatosplenomegaly.
Findings are nonspecific. The adenopathy is favored to be reactive
and could relate to an indolent infectious process. An indolent
lymphoproliferative process is felt unlikely, given relative
stability of adenopathy.
3.  No acute process in the chest.
4. New trace cul-de-sac fluid of indeterminate etiology.

## 2020-09-19 DIAGNOSIS — Z20822 Contact with and (suspected) exposure to covid-19: Secondary | ICD-10-CM | POA: Diagnosis not present

## 2021-03-08 DIAGNOSIS — H52223 Regular astigmatism, bilateral: Secondary | ICD-10-CM | POA: Diagnosis not present

## 2021-03-08 DIAGNOSIS — H5203 Hypermetropia, bilateral: Secondary | ICD-10-CM | POA: Diagnosis not present

## 2022-07-17 ENCOUNTER — Ambulatory Visit: Payer: Self-pay

## 2022-07-17 NOTE — Telephone Encounter (Signed)
Message from [place] sent at 07/17/2022 11:37 AM EST  Summary: Pt experiencing trimmers and would like a nurse to return his call   Pt reports that he has been experiencing trimmers and he would like a nurse to return his call to advise what he can do until his appt in February. Cb# [phone]        Left worse than right   32 years  Use to take propranolol and stopped I'm early 28  If stress or heart rate rises makes worse New pt appt made for pt through open scheduling. Advised pt to call back if able be seen sooner. Placed appt on wait list. Reason for Disposition  Nursing judgment or information in reference  Answer Assessment - Initial Assessment Questions 1. SYMPTOM: "What is the main symptom you are concerned about?" (e.g., weakness, numbness)     Hand tremors 2. ONSET: "When did this start?" (minutes, hours, days; while sleeping)     32 years ago 3. LAST NORMAL: "When was the last time you (the patient) were normal (no symptoms)?"     It is a chronic problem that has worsened 4. PATTERN "Does this come and go, or has it been constant since it started?"  "Is it present now?"     Has tremors when using hands only 5. CARDIAC SYMPTOMS: "Have you had any of the following symptoms: chest pain, difficulty breathing, palpitations?"     N/a 6. NEUROLOGIC SYMPTOMS: "Have you had any of the following symptoms: headache, dizziness, vision loss, double vision, changes in speech, unsteady on your feet?"     N/a 7. OTHER SYMPTOMS: "Do you have any other symptoms?"     no 8. PREGNANCY: "Is there any chance you are pregnant?" "When was your last menstrual period?"     N/a  Answer Assessment - Initial Assessment Questions 1. REASON FOR CALL: "What is your main concern right now?"     Tremors to hands  2. ONSET: "When did the tremors start?"     32 years ago 3. SEVERITY: "How bad is the *No Answer*?"     Pt stated noted worse to the right than left 4. FEVER: "Do you have a  fever?"     no 5. OTHER SYMPTOMS: "Do you have any other new symptoms?"     no 6. TREATMENTS AND RESPONSE: "What have you done so far to try to make this better? What medicines have you used?"     Would like to start back on Propranolol 7. PREGNANCY: "Is there any chance you are pregnant?" "When was your last menstrual period?"     N/a  Protocols used: Neurologic Deficit-A-AH, No Guideline Available-A-AH

## 2022-08-10 DIAGNOSIS — H5203 Hypermetropia, bilateral: Secondary | ICD-10-CM | POA: Diagnosis not present

## 2022-08-10 DIAGNOSIS — H52223 Regular astigmatism, bilateral: Secondary | ICD-10-CM | POA: Diagnosis not present

## 2022-08-23 ENCOUNTER — Ambulatory Visit: Payer: BC Managed Care – PPO | Admitting: Nurse Practitioner

## 2022-08-23 ENCOUNTER — Other Ambulatory Visit: Payer: Self-pay

## 2022-08-23 ENCOUNTER — Encounter: Payer: Self-pay | Admitting: Nurse Practitioner

## 2022-08-23 VITALS — BP 124/70 | HR 89 | Temp 98.5°F | Resp 18 | Ht 71.0 in | Wt 235.7 lb

## 2022-08-23 DIAGNOSIS — Z1159 Encounter for screening for other viral diseases: Secondary | ICD-10-CM

## 2022-08-23 DIAGNOSIS — R251 Tremor, unspecified: Secondary | ICD-10-CM | POA: Diagnosis not present

## 2022-08-23 DIAGNOSIS — Z114 Encounter for screening for human immunodeficiency virus [HIV]: Secondary | ICD-10-CM

## 2022-08-23 DIAGNOSIS — Z13220 Encounter for screening for lipoid disorders: Secondary | ICD-10-CM

## 2022-08-23 DIAGNOSIS — Z113 Encounter for screening for infections with a predominantly sexual mode of transmission: Secondary | ICD-10-CM

## 2022-08-23 DIAGNOSIS — Z131 Encounter for screening for diabetes mellitus: Secondary | ICD-10-CM

## 2022-08-23 DIAGNOSIS — Z1211 Encounter for screening for malignant neoplasm of colon: Secondary | ICD-10-CM | POA: Diagnosis not present

## 2022-08-23 DIAGNOSIS — Z130 Encounter for screening for diseases of the blood and blood-forming organs and certain disorders involving the immune mechanism: Secondary | ICD-10-CM

## 2022-08-23 MED ORDER — PROPRANOLOL HCL 40 MG PO TABS: 40.0000 mg | ORAL_TABLET | Freq: Two times a day (BID) | ORAL | 15 refills | Status: AC

## 2022-08-23 NOTE — Progress Notes (Signed)
BP 124/70   Pulse 89   Temp 98.5 F (36.9 C) (Oral)   Resp 18   Ht 5\' 11"  (1.803 m)   Wt 235 lb 11.2 oz (106.9 kg)   SpO2 98%   BMI 32.87 kg/m    Subjective:    Patient ID: , male    DOB: [birth year], 46 y.o.   MRN: [id number]  HPI: Tejas Seawood is a 46 y.o. male  Chief Complaint  Patient presents with   Establish Care   Tremors    Bilateral hand left is worst then right. (46 years old when tremors started)   Establish care: his last physical was long time ago.  Colorectal cancer screening due.  Medical history includes tremors.  Family history includes tremors.  Essential tremor: patient has had tremors in both hands for several years. He reports since he was 46 years old.  He used to take propanolol which helped. He says he stopped taking it about ten years ago.  Patient reports the left is worse than right. He denies any bradykinesia , rigidity or postural instability.  He says it is exacerbated when he has an adrenaline rush. He says he did have a complete work up when he was younger and was told it was a benign tremor. He says he does have an old prescription of propanolol that he does occasionally take two of when he has to go to the gun range.  Discussed restarting the propanolol and taking 40 mg BID.  Patient is in agreement with plan.   Relevant past medical, surgical, family and social history reviewed and updated as indicated. Interim medical history since our last visit reviewed. Allergies and medications reviewed and updated.  Review of Systems  Constitutional: Negative for fever or weight change.  Respiratory: Negative for cough and shortness of breath.   Cardiovascular: Negative for chest pain or palpitations.  Gastrointestinal: Negative for abdominal pain, no bowel changes.  Musculoskeletal: Negative for gait problem or joint swelling.  Skin: Negative for rash.  Neurological: Negative for dizziness or headache.  No other specific complaints in a  complete review of systems (except as listed in HPI above).      Objective:    BP 124/70   Pulse 89   Temp 98.5 F (36.9 C) (Oral)   Resp 18   Ht 5\' 11"  (1.803 m)   Wt 235 lb 11.2 oz (106.9 kg)   SpO2 98%   BMI 32.87 kg/m   Wt Readings from Last 3 Encounters:  08/23/22 235 lb 11.2 oz (106.9 kg)  11/07/15 220 lb (99.8 kg)    Physical Exam  Constitutional: Patient appears well-developed and well-nourished.  No distress.  HEENT: head atraumatic, normocephalic, pupils equal and reactive to light, neck supple Cardiovascular: Normal rate, regular rhythm and normal heart sounds.  No murmur heard. No BLE edema. Pulmonary/Chest: Effort normal and breath sounds normal. No respiratory distress. Abdominal: Soft.  There is no tenderness. Psychiatric: Patient has a normal mood and affect. behavior is normal. Judgment and thought content normal.   Results for orders placed or performed during the hospital encounter of 11/07/15  APTT  Result Value Ref Range   aPTT 31 24 - 36 seconds  CBC with Differential/Platelet  Result Value Ref Range   WBC 6.7 3.8 - 10.6 K/uL   RBC 4.27 (L) 4.40 - 5.90 MIL/uL   Hemoglobin 11.4 (L) 13.0 - 18.0 g/dL   HCT 11/09/15 (L) 11/09/15 - 76.1 %   MCV 81.5  80.0 - 100.0 fL   MCH 26.8 26.0 - 34.0 pg   MCHC 32.9 32.0 - 36.0 g/dL   RDW 60.6 (H) 30.1 - 60.1 %   Platelets 218 150 - 440 K/uL   Neutrophils Relative % 67 %   Neutro Abs 4.5 1.4 - 6.5 K/uL   Lymphocytes Relative 17 %   Lymphs Abs 1.1 1.0 - 3.6 K/uL   Monocytes Relative 14 %   Monocytes Absolute 0.9 0.2 - 1.0 K/uL   Eosinophils Relative 2 %   Eosinophils Absolute 0.1 0 - 0.7 K/uL   Basophils Relative 0 %   Basophils Absolute 0.0 0 - 0.1 K/uL  Comprehensive metabolic panel  Result Value Ref Range   Sodium 138 135 - 145 mmol/L   Potassium 4.3 3.5 - 5.1 mmol/L   Chloride 103 101 - 111 mmol/L   CO2 28 22 - 32 mmol/L   Glucose, Bld 94 65 - 99 mg/dL   BUN 9 6 - 20 mg/dL   Creatinine, Ser 0.93 0.61 - 1.24  mg/dL   Calcium 9.2 8.9 - 23.5 mg/dL   Total Protein 7.6 6.5 - 8.1 g/dL   Albumin 3.4 (L) 3.5 - 5.0 g/dL   AST 20 15 - 41 U/L   ALT 28 17 - 63 U/L   Alkaline Phosphatase 63 38 - 126 U/L   Total Bilirubin 0.5 0.3 - 1.2 mg/dL   GFR calc non Af Amer >60 >60 mL/min   GFR calc Af Amer >60 >60 mL/min   Anion gap 7 5 - 15  Protime-INR  Result Value Ref Range   Prothrombin Time 15.1 (H) 11.4 - 15.0 seconds   INR 1.17   Surgical pathology  Result Value Ref Range   SURGICAL PATHOLOGY      Surgical Pathology CASE: [id number] PATIENT: [name] Surgical Pathology Report     SPECIMEN SUBMITTED: A. Liver, biopsy  CLINICAL HISTORY: FUO, abnl LFTs, hepatomegaly  PRE-OPERATIVE DIAGNOSIS: None provided  POST-OPERATIVE DIAGNOSIS: None provided.     DIAGNOSIS: A. LIVER; ULTRASOUND GUIDED BIOPSY: - INCREASE IN PORTAL AND LOBULAR EOSINOPHILS. - MILD HEPATITIC PROCESS. - MILDLY CONGESTED SINUSOIDS.  COMMENT: Sections demonstrate partially fragmented cores of liver tissue with adequate numbers of portal tracts present for evaluation. There is diffuse mild sinusoidal dilatation. Steatosis and cholestasis are not identified. A few of the portal tracts are moderately with a dense lymphoplasmacytic infiltrate with prominent eosinophils. Bile ducts are intact and unremarkable the PASD stain highlights ceroid-containing Kupffer cells and portal histiocytes. There is no increase in fibrosis; Trichrome stain confirms. Stainable iron is not present . Stain controls worked appropriately. Overall, the findings are consistent with an ongoing mild hepatitic process. Given the eosinophilic infiltrate, my primary concern is for drug etiology. Other causes include a parasitic infection. Review of recent CBC does not reveal peripheral eosinophilia. The mildly congested sinusoids may reflect a degree of venous outflow obstruction.     GROSS DESCRIPTION:  A. Labeled: liver  biopsy  Tissue fragment(s): 4  Size: 0.3 -2.5 cm in length and in diameter 0.1  Description: brown cores/fragments, wrapped in lens paper and submitted a mesh bag  Entirely submitted in 1 cassette(s).    Final Diagnosis performed by Elijah Birk, MD.  Electronically signed 11/09/2015 9:45:29AM    The electronic signature indicates that the named Attending Pathologist has evaluated the specimen  Technical component performed at [facility], [address], [place], [place] [address] Lab: [phone] Dir: Titus Dubin. Cato Mulligan, MD  Professional comp onent performed  at [facility], [facility], [address] [place], [place], [place] [address] Lab: [phone] Dir: Georgiann Cocker. Rubinas, MD        Assessment & Plan:   Problem List Items Addressed This Visit       Other   Tremor - Primary    Chronic issue, will restart propanolol and increase to 40 mg BID.        Relevant Medications   propranolol (INDERAL) 40 MG tablet   Other Relevant Orders   COMPLETE METABOLIC PANEL WITH GFR   TSH   Other Visit Diagnoses     Encounter for screening colonoscopy       Relevant Orders   Cologuard   Screening for diabetes mellitus       Relevant Orders   Hemoglobin A1c   Screening for cholesterol level       Relevant Orders   Lipid panel   Screening for deficiency anemia       Relevant Orders   CBC with Differential/Platelet   Screening for HIV without presence of risk factors       Relevant Orders   HIV Antibody (routine testing w rflx)   Encounter for hepatitis C screening test for low risk patient       Relevant Orders   Hepatitis C antibody   Screening for STDs (sexually transmitted diseases)       Relevant Orders   RPR        Follow up plan: Return for cpe.  Patient did report that it is difficult for him to take off work for doctor appointments.

## 2022-08-23 NOTE — Assessment & Plan Note (Signed)
Chronic issue, will restart propanolol and increase to 40 mg BID.

## 2022-08-24 LAB — COMPLETE METABOLIC PANEL WITH GFR
AG Ratio: 1.8 (calc) (ref 1.0–2.5)
ALT: 16 U/L (ref 9–46)
AST: 17 U/L (ref 10–40)
Albumin: 4.8 g/dL (ref 3.6–5.1)
Alkaline phosphatase (APISO): 56 U/L (ref 36–130)
BUN: 15 mg/dL (ref 7–25)
CO2: 28 mmol/L (ref 20–32)
Calcium: 10 mg/dL (ref 8.6–10.3)
Chloride: 103 mmol/L (ref 98–110)
Creat: 0.98 mg/dL (ref 0.60–1.29)
Globulin: 2.6 g/dL (calc) (ref 1.9–3.7)
Glucose, Bld: 90 mg/dL (ref 65–99)
Potassium: 4.7 mmol/L (ref 3.5–5.3)
Sodium: 140 mmol/L (ref 135–146)
Total Bilirubin: 0.6 mg/dL (ref 0.2–1.2)
Total Protein: 7.4 g/dL (ref 6.1–8.1)
eGFR: 95 mL/min/{1.73_m2} (ref >=60)

## 2022-08-24 LAB — CBC WITH DIFFERENTIAL/PLATELET
Absolute Monocytes: 550 cells/uL (ref 200–950)
Basophils Absolute: 30 cells/uL (ref 0–200)
Basophils Relative: 0.6 %
Eosinophils Absolute: 110 cells/uL (ref 15–500)
Eosinophils Relative: 2.2 %
HCT: 46.2 % (ref 38.5–50.0)
Hemoglobin: 16.1 g/dL (ref 13.2–17.1)
Lymphs Abs: 1410 cells/uL (ref 850–3900)
MCH: 30.5 pg (ref 27.0–33.0)
MCHC: 34.8 g/dL (ref 32.0–36.0)
MCV: 87.5 fL (ref 80.0–100.0)
MPV: 11.1 fL (ref 7.5–12.5)
Monocytes Relative: 11 %
Neutro Abs: 2900 cells/uL (ref 1500–7800)
Neutrophils Relative %: 58 %
Platelets: 174 10*3/uL (ref 140–400)
RBC: 5.28 10*6/uL (ref 4.20–5.80)
RDW: 12.8 % (ref 11.0–15.0)
Total Lymphocyte: 28.2 %
WBC: 5 10*3/uL (ref 3.8–10.8)

## 2022-08-24 LAB — TSH: TSH: 1.25 mIU/L (ref 0.40–4.50)

## 2022-08-24 LAB — HEMOGLOBIN A1C
Hgb A1c MFr Bld: 5.4 % of total Hgb (ref <5.7)
Mean Plasma Glucose: 108 mg/dL
eAG (mmol/L): 6 mmol/L

## 2022-08-24 LAB — HEPATITIS C ANTIBODY: Hepatitis C Ab: NONREACTIVE

## 2022-08-24 LAB — RPR: RPR Ser Ql: NONREACTIVE

## 2022-08-24 LAB — HIV ANTIBODY (ROUTINE TESTING W REFLEX): HIV 1&2 Ab, 4th Generation: NONREACTIVE

## 2022-11-07 DIAGNOSIS — Z1211 Encounter for screening for malignant neoplasm of colon: Secondary | ICD-10-CM | POA: Diagnosis not present

## 2022-11-08 ENCOUNTER — Ambulatory Visit: Payer: Self-pay | Admitting: Family Medicine

## 2022-11-16 LAB — COLOGUARD: COLOGUARD: NEGATIVE

## 2023-05-07 DIAGNOSIS — Z125 Encounter for screening for malignant neoplasm of prostate: Secondary | ICD-10-CM | POA: Diagnosis not present

## 2023-05-07 DIAGNOSIS — E291 Testicular hypofunction: Secondary | ICD-10-CM | POA: Diagnosis not present

## 2023-05-07 DIAGNOSIS — N400 Enlarged prostate without lower urinary tract symptoms: Secondary | ICD-10-CM | POA: Diagnosis not present

## 2023-05-07 DIAGNOSIS — R947 Abnormal results of other endocrine function studies: Secondary | ICD-10-CM | POA: Diagnosis not present

## 2023-05-22 DIAGNOSIS — M1711 Unilateral primary osteoarthritis, right knee: Secondary | ICD-10-CM | POA: Diagnosis not present

## 2023-05-24 DIAGNOSIS — E291 Testicular hypofunction: Secondary | ICD-10-CM | POA: Diagnosis not present

## 2023-06-20 DIAGNOSIS — E291 Testicular hypofunction: Secondary | ICD-10-CM | POA: Diagnosis not present

## 2023-08-31 ENCOUNTER — Other Ambulatory Visit: Payer: Self-pay | Admitting: Nurse Practitioner

## 2023-08-31 DIAGNOSIS — R251 Tremor, unspecified: Secondary | ICD-10-CM

## 2023-09-02 NOTE — Telephone Encounter (Signed)
Too soon last reordered 08/23/22 #[id number] RF Requested Prescriptions  Refused Prescriptions Disp Refills   propranolol (INDERAL) 40 MG tablet [Pharmacy Med Name: PROPRANOLOL 40 MG TABLET] 180 tablet 5    Sig: TAKE 1 TABLET BY MOUTH TWICE A DAY     Cardiovascular:  Beta Blockers Failed - 09/02/2023  4:28 PM      Failed - Valid encounter within last 6 months    Recent Outpatient Visits           1 year ago Tremor   [facility] Berniece Salines, FNP              Passed - Last BP in normal range    BP Readings from Last 1 Encounters:  08/23/22 124/70         Passed - Last Heart Rate in normal range    Pulse Readings from Last 1 Encounters:  08/23/22 89

## 2023-09-18 DIAGNOSIS — Z4889 Encounter for other specified surgical aftercare: Secondary | ICD-10-CM | POA: Diagnosis not present

## 2023-10-07 DIAGNOSIS — M1711 Unilateral primary osteoarthritis, right knee: Secondary | ICD-10-CM | POA: Diagnosis not present

## 2023-10-10 DIAGNOSIS — E291 Testicular hypofunction: Secondary | ICD-10-CM | POA: Diagnosis not present

## 2023-10-10 DIAGNOSIS — R7301 Impaired fasting glucose: Secondary | ICD-10-CM | POA: Diagnosis not present

## 2023-10-10 DIAGNOSIS — Z125 Encounter for screening for malignant neoplasm of prostate: Secondary | ICD-10-CM | POA: Diagnosis not present

## 2023-10-10 DIAGNOSIS — E039 Hypothyroidism, unspecified: Secondary | ICD-10-CM | POA: Diagnosis not present

## 2023-10-10 DIAGNOSIS — Z131 Encounter for screening for diabetes mellitus: Secondary | ICD-10-CM | POA: Diagnosis not present

## 2023-10-10 DIAGNOSIS — E785 Hyperlipidemia, unspecified: Secondary | ICD-10-CM | POA: Diagnosis not present

## 2023-10-10 DIAGNOSIS — E559 Vitamin D deficiency, unspecified: Secondary | ICD-10-CM | POA: Diagnosis not present

## 2023-10-10 DIAGNOSIS — D649 Anemia, unspecified: Secondary | ICD-10-CM | POA: Diagnosis not present

## 2023-11-13 DIAGNOSIS — M1711 Unilateral primary osteoarthritis, right knee: Secondary | ICD-10-CM | POA: Diagnosis not present

## 2023-11-13 DIAGNOSIS — Z01818 Encounter for other preprocedural examination: Secondary | ICD-10-CM | POA: Diagnosis not present

## 2023-11-21 DIAGNOSIS — M1711 Unilateral primary osteoarthritis, right knee: Secondary | ICD-10-CM | POA: Diagnosis not present

## 2023-11-21 DIAGNOSIS — G8918 Other acute postprocedural pain: Secondary | ICD-10-CM | POA: Diagnosis not present

## 2023-11-26 ENCOUNTER — Ambulatory Visit: Payer: BLUE CROSS/BLUE SHIELD | Admitting: Physical Therapy

## 2023-11-28 ENCOUNTER — Encounter: Payer: BLUE CROSS/BLUE SHIELD | Admitting: Physical Therapy

## 2023-11-28 ENCOUNTER — Ambulatory Visit: Payer: BLUE CROSS/BLUE SHIELD | Admitting: Physical Therapy

## 2023-12-02 ENCOUNTER — Encounter: Admitting: Physical Therapy

## 2023-12-03 ENCOUNTER — Encounter: Payer: BLUE CROSS/BLUE SHIELD | Admitting: Physical Therapy

## 2023-12-05 ENCOUNTER — Encounter: Payer: BLUE CROSS/BLUE SHIELD | Admitting: Physical Therapy

## 2023-12-09 ENCOUNTER — Encounter: Admitting: Physical Therapy

## 2023-12-10 ENCOUNTER — Encounter: Payer: BLUE CROSS/BLUE SHIELD | Admitting: Physical Therapy

## 2023-12-12 ENCOUNTER — Encounter: Payer: BLUE CROSS/BLUE SHIELD | Admitting: Physical Therapy

## 2023-12-16 ENCOUNTER — Encounter: Admitting: Physical Therapy

## 2023-12-17 ENCOUNTER — Encounter: Payer: BLUE CROSS/BLUE SHIELD | Admitting: Physical Therapy

## 2023-12-19 ENCOUNTER — Encounter: Payer: BLUE CROSS/BLUE SHIELD | Admitting: Physical Therapy

## 2023-12-23 ENCOUNTER — Encounter: Admitting: Physical Therapy

## 2023-12-24 ENCOUNTER — Encounter: Payer: BLUE CROSS/BLUE SHIELD | Admitting: Physical Therapy

## 2023-12-26 ENCOUNTER — Encounter: Payer: BLUE CROSS/BLUE SHIELD | Admitting: Physical Therapy

## 2023-12-30 ENCOUNTER — Encounter: Admitting: Physical Therapy

## 2023-12-31 ENCOUNTER — Encounter: Payer: BLUE CROSS/BLUE SHIELD | Admitting: Physical Therapy

## 2024-01-02 ENCOUNTER — Encounter: Payer: BLUE CROSS/BLUE SHIELD | Admitting: Physical Therapy

## 2024-01-07 ENCOUNTER — Encounter: Payer: BLUE CROSS/BLUE SHIELD | Admitting: Physical Therapy

## 2024-01-09 ENCOUNTER — Encounter: Payer: BLUE CROSS/BLUE SHIELD | Admitting: Physical Therapy

## 2024-01-10 DIAGNOSIS — E785 Hyperlipidemia, unspecified: Secondary | ICD-10-CM | POA: Diagnosis not present

## 2024-01-10 DIAGNOSIS — E291 Testicular hypofunction: Secondary | ICD-10-CM | POA: Diagnosis not present

## 2024-03-05 DIAGNOSIS — R947 Abnormal results of other endocrine function studies: Secondary | ICD-10-CM | POA: Diagnosis not present

## 2024-03-05 DIAGNOSIS — E291 Testicular hypofunction: Secondary | ICD-10-CM | POA: Diagnosis not present

## 2024-03-05 DIAGNOSIS — D751 Secondary polycythemia: Secondary | ICD-10-CM | POA: Diagnosis not present

## 2024-03-05 DIAGNOSIS — Z5320 Procedure and treatment not carried out because of patient's decision for unspecified reasons: Secondary | ICD-10-CM | POA: Diagnosis not present

## 2024-03-11 DIAGNOSIS — G250 Essential tremor: Secondary | ICD-10-CM | POA: Diagnosis not present

## 2024-06-03 DIAGNOSIS — Z01818 Encounter for other preprocedural examination: Secondary | ICD-10-CM | POA: Diagnosis not present

## 2024-06-19 DIAGNOSIS — Z01818 Encounter for other preprocedural examination: Secondary | ICD-10-CM | POA: Diagnosis not present

## 2024-07-16 DIAGNOSIS — M1712 Unilateral primary osteoarthritis, left knee: Secondary | ICD-10-CM | POA: Diagnosis not present

## 2024-08-20 DIAGNOSIS — R947 Abnormal results of other endocrine function studies: Secondary | ICD-10-CM | POA: Diagnosis not present

## 2024-08-20 DIAGNOSIS — E291 Testicular hypofunction: Secondary | ICD-10-CM | POA: Diagnosis not present

## 2024-09-08 DIAGNOSIS — G250 Essential tremor: Secondary | ICD-10-CM | POA: Diagnosis not present
